# Patient Record
Sex: Male | Born: 1940 | Race: White | Hispanic: No | Smoking: Current every day smoker
Health system: Southern US, Community
[De-identification: ages and names within clinical notes are randomized; demographics above are authoritative.]

## PROBLEM LIST (undated history)

## (undated) ENCOUNTER — Emergency Department (HOSPITAL_COMMUNITY): Payer: HMO | Source: Home / Self Care

## (undated) DIAGNOSIS — I10 Essential (primary) hypertension: Secondary | ICD-10-CM

## (undated) DIAGNOSIS — I472 Ventricular tachycardia, unspecified: Secondary | ICD-10-CM

## (undated) DIAGNOSIS — I429 Cardiomyopathy, unspecified: Secondary | ICD-10-CM

## (undated) DIAGNOSIS — E785 Hyperlipidemia, unspecified: Secondary | ICD-10-CM

## (undated) DIAGNOSIS — I2510 Atherosclerotic heart disease of native coronary artery without angina pectoris: Secondary | ICD-10-CM

## (undated) DIAGNOSIS — I4891 Unspecified atrial fibrillation: Secondary | ICD-10-CM

## (undated) DIAGNOSIS — I110 Hypertensive heart disease with heart failure: Secondary | ICD-10-CM

## (undated) DIAGNOSIS — N182 Chronic kidney disease, stage 2 (mild): Secondary | ICD-10-CM

## (undated) DIAGNOSIS — F17200 Nicotine dependence, unspecified, uncomplicated: Secondary | ICD-10-CM

## (undated) DIAGNOSIS — C801 Malignant (primary) neoplasm, unspecified: Secondary | ICD-10-CM

## (undated) DIAGNOSIS — E119 Type 2 diabetes mellitus without complications: Secondary | ICD-10-CM

## (undated) DIAGNOSIS — E1122 Type 2 diabetes mellitus with diabetic chronic kidney disease: Secondary | ICD-10-CM

## (undated) HISTORY — DX: Essential (primary) hypertension: I10

## (undated) HISTORY — DX: Cardiomyopathy, unspecified: I42.9

## (undated) HISTORY — DX: Hypertensive heart disease with heart failure: I11.0

## (undated) HISTORY — PX: TONSILLECTOMY: SUR1361

## (undated) HISTORY — DX: Ventricular tachycardia, unspecified: I47.20

## (undated) HISTORY — DX: Atherosclerotic heart disease of native coronary artery without angina pectoris: I25.10

## (undated) HISTORY — DX: Nicotine dependence, unspecified, uncomplicated: F17.200

## (undated) HISTORY — DX: Ventricular tachycardia: I47.2

## (undated) HISTORY — DX: Chronic kidney disease, stage 2 (mild): N18.2

## (undated) HISTORY — DX: Hyperlipidemia, unspecified: E78.5

## (undated) HISTORY — DX: Unspecified atrial fibrillation: I48.91

## (undated) HISTORY — PX: IMPLANTABLE CARDIOVERTER DEFIBRILLATOR IMPLANT: SHX5860

## (undated) HISTORY — DX: Type 2 diabetes mellitus with diabetic chronic kidney disease: E11.22

---

## 2015-04-30 DIAGNOSIS — I5022 Chronic systolic (congestive) heart failure: Secondary | ICD-10-CM | POA: Insufficient documentation

## 2015-04-30 DIAGNOSIS — I472 Ventricular tachycardia, unspecified: Secondary | ICD-10-CM | POA: Insufficient documentation

## 2015-04-30 DIAGNOSIS — I213 ST elevation (STEMI) myocardial infarction of unspecified site: Secondary | ICD-10-CM | POA: Insufficient documentation

## 2015-04-30 DIAGNOSIS — E785 Hyperlipidemia, unspecified: Secondary | ICD-10-CM | POA: Insufficient documentation

## 2015-04-30 DIAGNOSIS — I2510 Atherosclerotic heart disease of native coronary artery without angina pectoris: Secondary | ICD-10-CM | POA: Insufficient documentation

## 2015-04-30 DIAGNOSIS — I429 Cardiomyopathy, unspecified: Secondary | ICD-10-CM | POA: Insufficient documentation

## 2015-04-30 DIAGNOSIS — I4891 Unspecified atrial fibrillation: Secondary | ICD-10-CM | POA: Insufficient documentation

## 2015-11-05 DIAGNOSIS — F17200 Nicotine dependence, unspecified, uncomplicated: Secondary | ICD-10-CM | POA: Diagnosis not present

## 2015-11-05 DIAGNOSIS — Z2821 Immunization not carried out because of patient refusal: Secondary | ICD-10-CM | POA: Diagnosis not present

## 2015-11-05 DIAGNOSIS — Z1211 Encounter for screening for malignant neoplasm of colon: Secondary | ICD-10-CM | POA: Diagnosis not present

## 2015-11-05 DIAGNOSIS — Z0000 Encounter for general adult medical examination without abnormal findings: Secondary | ICD-10-CM | POA: Diagnosis not present

## 2015-11-05 DIAGNOSIS — Z1212 Encounter for screening for malignant neoplasm of rectum: Secondary | ICD-10-CM | POA: Diagnosis not present

## 2015-11-05 DIAGNOSIS — Z125 Encounter for screening for malignant neoplasm of prostate: Secondary | ICD-10-CM | POA: Diagnosis not present

## 2015-11-05 DIAGNOSIS — E1169 Type 2 diabetes mellitus with other specified complication: Secondary | ICD-10-CM | POA: Diagnosis not present

## 2015-11-24 DIAGNOSIS — I4891 Unspecified atrial fibrillation: Secondary | ICD-10-CM | POA: Diagnosis not present

## 2015-11-24 DIAGNOSIS — E785 Hyperlipidemia, unspecified: Secondary | ICD-10-CM | POA: Diagnosis not present

## 2015-11-24 DIAGNOSIS — I110 Hypertensive heart disease with heart failure: Secondary | ICD-10-CM | POA: Diagnosis not present

## 2015-11-24 DIAGNOSIS — I472 Ventricular tachycardia: Secondary | ICD-10-CM | POA: Diagnosis not present

## 2015-11-24 DIAGNOSIS — E1169 Type 2 diabetes mellitus with other specified complication: Secondary | ICD-10-CM | POA: Diagnosis not present

## 2015-11-24 DIAGNOSIS — F17200 Nicotine dependence, unspecified, uncomplicated: Secondary | ICD-10-CM | POA: Diagnosis not present

## 2015-11-27 DIAGNOSIS — E1169 Type 2 diabetes mellitus with other specified complication: Secondary | ICD-10-CM | POA: Diagnosis not present

## 2016-03-04 DIAGNOSIS — Z9181 History of falling: Secondary | ICD-10-CM | POA: Diagnosis not present

## 2016-03-04 DIAGNOSIS — I110 Hypertensive heart disease with heart failure: Secondary | ICD-10-CM | POA: Diagnosis not present

## 2016-03-04 DIAGNOSIS — E785 Hyperlipidemia, unspecified: Secondary | ICD-10-CM | POA: Diagnosis not present

## 2016-03-04 DIAGNOSIS — E1169 Type 2 diabetes mellitus with other specified complication: Secondary | ICD-10-CM | POA: Diagnosis not present

## 2016-03-04 DIAGNOSIS — Z136 Encounter for screening for cardiovascular disorders: Secondary | ICD-10-CM | POA: Diagnosis not present

## 2016-03-04 DIAGNOSIS — Z0000 Encounter for general adult medical examination without abnormal findings: Secondary | ICD-10-CM | POA: Diagnosis not present

## 2016-03-17 DIAGNOSIS — E1169 Type 2 diabetes mellitus with other specified complication: Secondary | ICD-10-CM | POA: Diagnosis not present

## 2016-03-17 DIAGNOSIS — I429 Cardiomyopathy, unspecified: Secondary | ICD-10-CM | POA: Diagnosis not present

## 2016-03-17 DIAGNOSIS — I110 Hypertensive heart disease with heart failure: Secondary | ICD-10-CM | POA: Diagnosis not present

## 2016-03-17 DIAGNOSIS — Z720 Tobacco use: Secondary | ICD-10-CM | POA: Diagnosis not present

## 2016-03-17 DIAGNOSIS — E785 Hyperlipidemia, unspecified: Secondary | ICD-10-CM | POA: Diagnosis not present

## 2016-03-17 DIAGNOSIS — I4891 Unspecified atrial fibrillation: Secondary | ICD-10-CM | POA: Diagnosis not present

## 2016-03-17 DIAGNOSIS — H6092 Unspecified otitis externa, left ear: Secondary | ICD-10-CM | POA: Diagnosis not present

## 2016-03-30 DIAGNOSIS — R5383 Other fatigue: Secondary | ICD-10-CM | POA: Diagnosis not present

## 2016-03-30 DIAGNOSIS — H6122 Impacted cerumen, left ear: Secondary | ICD-10-CM | POA: Diagnosis not present

## 2016-03-30 DIAGNOSIS — H9190 Unspecified hearing loss, unspecified ear: Secondary | ICD-10-CM | POA: Diagnosis not present

## 2016-04-25 DIAGNOSIS — E1122 Type 2 diabetes mellitus with diabetic chronic kidney disease: Secondary | ICD-10-CM | POA: Diagnosis not present

## 2016-06-21 DIAGNOSIS — H6242 Otitis externa in other diseases classified elsewhere, left ear: Secondary | ICD-10-CM | POA: Diagnosis not present

## 2016-06-21 DIAGNOSIS — I472 Ventricular tachycardia: Secondary | ICD-10-CM | POA: Diagnosis not present

## 2016-06-21 DIAGNOSIS — F17200 Nicotine dependence, unspecified, uncomplicated: Secondary | ICD-10-CM | POA: Diagnosis not present

## 2016-06-21 DIAGNOSIS — I4891 Unspecified atrial fibrillation: Secondary | ICD-10-CM | POA: Diagnosis not present

## 2016-06-21 DIAGNOSIS — B369 Superficial mycosis, unspecified: Secondary | ICD-10-CM | POA: Diagnosis not present

## 2016-06-21 DIAGNOSIS — N182 Chronic kidney disease, stage 2 (mild): Secondary | ICD-10-CM | POA: Diagnosis not present

## 2016-06-21 DIAGNOSIS — I110 Hypertensive heart disease with heart failure: Secondary | ICD-10-CM | POA: Diagnosis not present

## 2016-06-21 DIAGNOSIS — E785 Hyperlipidemia, unspecified: Secondary | ICD-10-CM | POA: Diagnosis not present

## 2016-06-21 DIAGNOSIS — E1122 Type 2 diabetes mellitus with diabetic chronic kidney disease: Secondary | ICD-10-CM | POA: Diagnosis not present

## 2016-06-21 DIAGNOSIS — I429 Cardiomyopathy, unspecified: Secondary | ICD-10-CM | POA: Diagnosis not present

## 2016-06-28 DIAGNOSIS — H9212 Otorrhea, left ear: Secondary | ICD-10-CM | POA: Diagnosis not present

## 2016-06-28 DIAGNOSIS — H6121 Impacted cerumen, right ear: Secondary | ICD-10-CM | POA: Diagnosis not present

## 2016-06-28 DIAGNOSIS — N182 Chronic kidney disease, stage 2 (mild): Secondary | ICD-10-CM | POA: Diagnosis not present

## 2016-06-28 DIAGNOSIS — E1122 Type 2 diabetes mellitus with diabetic chronic kidney disease: Secondary | ICD-10-CM | POA: Diagnosis not present

## 2016-07-05 DIAGNOSIS — E1122 Type 2 diabetes mellitus with diabetic chronic kidney disease: Secondary | ICD-10-CM | POA: Diagnosis not present

## 2016-07-25 DIAGNOSIS — J342 Deviated nasal septum: Secondary | ICD-10-CM | POA: Diagnosis not present

## 2016-07-25 DIAGNOSIS — H60502 Unspecified acute noninfective otitis externa, left ear: Secondary | ICD-10-CM | POA: Diagnosis not present

## 2016-07-25 DIAGNOSIS — F17200 Nicotine dependence, unspecified, uncomplicated: Secondary | ICD-10-CM | POA: Diagnosis not present

## 2016-07-25 DIAGNOSIS — K143 Hypertrophy of tongue papillae: Secondary | ICD-10-CM | POA: Diagnosis not present

## 2016-08-12 DIAGNOSIS — I429 Cardiomyopathy, unspecified: Secondary | ICD-10-CM | POA: Diagnosis not present

## 2016-08-12 DIAGNOSIS — I252 Old myocardial infarction: Secondary | ICD-10-CM | POA: Diagnosis not present

## 2016-08-12 DIAGNOSIS — I472 Ventricular tachycardia: Secondary | ICD-10-CM | POA: Diagnosis not present

## 2016-08-12 DIAGNOSIS — Z95810 Presence of automatic (implantable) cardiac defibrillator: Secondary | ICD-10-CM | POA: Diagnosis not present

## 2016-10-07 DIAGNOSIS — Z95810 Presence of automatic (implantable) cardiac defibrillator: Secondary | ICD-10-CM | POA: Diagnosis not present

## 2016-10-28 DIAGNOSIS — E1122 Type 2 diabetes mellitus with diabetic chronic kidney disease: Secondary | ICD-10-CM | POA: Diagnosis not present

## 2016-10-28 DIAGNOSIS — E785 Hyperlipidemia, unspecified: Secondary | ICD-10-CM | POA: Diagnosis not present

## 2016-10-28 DIAGNOSIS — I110 Hypertensive heart disease with heart failure: Secondary | ICD-10-CM | POA: Diagnosis not present

## 2016-10-28 DIAGNOSIS — N182 Chronic kidney disease, stage 2 (mild): Secondary | ICD-10-CM | POA: Diagnosis not present

## 2016-11-11 DIAGNOSIS — F17200 Nicotine dependence, unspecified, uncomplicated: Secondary | ICD-10-CM | POA: Diagnosis not present

## 2016-11-11 DIAGNOSIS — R05 Cough: Secondary | ICD-10-CM | POA: Diagnosis not present

## 2017-01-06 DIAGNOSIS — Z95810 Presence of automatic (implantable) cardiac defibrillator: Secondary | ICD-10-CM | POA: Diagnosis not present

## 2017-01-31 DIAGNOSIS — E785 Hyperlipidemia, unspecified: Secondary | ICD-10-CM | POA: Diagnosis not present

## 2017-01-31 DIAGNOSIS — N182 Chronic kidney disease, stage 2 (mild): Secondary | ICD-10-CM | POA: Diagnosis not present

## 2017-01-31 DIAGNOSIS — I110 Hypertensive heart disease with heart failure: Secondary | ICD-10-CM | POA: Diagnosis not present

## 2017-01-31 DIAGNOSIS — E1122 Type 2 diabetes mellitus with diabetic chronic kidney disease: Secondary | ICD-10-CM | POA: Diagnosis not present

## 2017-02-09 DIAGNOSIS — R21 Rash and other nonspecific skin eruption: Secondary | ICD-10-CM | POA: Diagnosis not present

## 2017-03-07 DIAGNOSIS — Z125 Encounter for screening for malignant neoplasm of prostate: Secondary | ICD-10-CM | POA: Diagnosis not present

## 2017-03-07 DIAGNOSIS — Z1211 Encounter for screening for malignant neoplasm of colon: Secondary | ICD-10-CM | POA: Diagnosis not present

## 2017-03-07 DIAGNOSIS — Z0000 Encounter for general adult medical examination without abnormal findings: Secondary | ICD-10-CM | POA: Diagnosis not present

## 2017-03-07 DIAGNOSIS — I110 Hypertensive heart disease with heart failure: Secondary | ICD-10-CM | POA: Diagnosis not present

## 2017-03-30 DIAGNOSIS — H25813 Combined forms of age-related cataract, bilateral: Secondary | ICD-10-CM | POA: Diagnosis not present

## 2017-03-30 DIAGNOSIS — E119 Type 2 diabetes mellitus without complications: Secondary | ICD-10-CM | POA: Diagnosis not present

## 2017-05-11 ENCOUNTER — Other Ambulatory Visit: Payer: Self-pay

## 2017-05-11 DIAGNOSIS — R634 Abnormal weight loss: Secondary | ICD-10-CM | POA: Insufficient documentation

## 2017-05-11 DIAGNOSIS — E1122 Type 2 diabetes mellitus with diabetic chronic kidney disease: Secondary | ICD-10-CM | POA: Diagnosis not present

## 2017-05-11 DIAGNOSIS — J342 Deviated nasal septum: Secondary | ICD-10-CM | POA: Insufficient documentation

## 2017-05-11 DIAGNOSIS — R059 Cough, unspecified: Secondary | ICD-10-CM | POA: Insufficient documentation

## 2017-05-11 DIAGNOSIS — R079 Chest pain, unspecified: Secondary | ICD-10-CM | POA: Insufficient documentation

## 2017-05-11 DIAGNOSIS — F17200 Nicotine dependence, unspecified, uncomplicated: Secondary | ICD-10-CM | POA: Insufficient documentation

## 2017-05-11 DIAGNOSIS — Z2821 Immunization not carried out because of patient refusal: Secondary | ICD-10-CM | POA: Insufficient documentation

## 2017-05-11 DIAGNOSIS — I5020 Unspecified systolic (congestive) heart failure: Secondary | ICD-10-CM | POA: Insufficient documentation

## 2017-05-11 DIAGNOSIS — N182 Chronic kidney disease, stage 2 (mild): Secondary | ICD-10-CM | POA: Diagnosis not present

## 2017-05-11 DIAGNOSIS — H9212 Otorrhea, left ear: Secondary | ICD-10-CM | POA: Insufficient documentation

## 2017-05-11 DIAGNOSIS — H6092 Unspecified otitis externa, left ear: Secondary | ICD-10-CM | POA: Insufficient documentation

## 2017-05-11 DIAGNOSIS — E119 Type 2 diabetes mellitus without complications: Secondary | ICD-10-CM | POA: Insufficient documentation

## 2017-05-11 DIAGNOSIS — I110 Hypertensive heart disease with heart failure: Secondary | ICD-10-CM | POA: Diagnosis not present

## 2017-05-11 DIAGNOSIS — R9389 Abnormal findings on diagnostic imaging of other specified body structures: Secondary | ICD-10-CM | POA: Insufficient documentation

## 2017-05-11 DIAGNOSIS — I252 Old myocardial infarction: Secondary | ICD-10-CM | POA: Insufficient documentation

## 2017-05-11 DIAGNOSIS — Z95810 Presence of automatic (implantable) cardiac defibrillator: Secondary | ICD-10-CM | POA: Insufficient documentation

## 2017-05-11 DIAGNOSIS — K143 Hypertrophy of tongue papillae: Secondary | ICD-10-CM | POA: Insufficient documentation

## 2017-05-11 DIAGNOSIS — R05 Cough: Secondary | ICD-10-CM | POA: Insufficient documentation

## 2017-05-11 DIAGNOSIS — E785 Hyperlipidemia, unspecified: Secondary | ICD-10-CM | POA: Diagnosis not present

## 2017-05-11 DIAGNOSIS — B351 Tinea unguium: Secondary | ICD-10-CM | POA: Insufficient documentation

## 2017-05-25 DIAGNOSIS — N182 Chronic kidney disease, stage 2 (mild): Secondary | ICD-10-CM | POA: Diagnosis not present

## 2017-05-25 DIAGNOSIS — E1122 Type 2 diabetes mellitus with diabetic chronic kidney disease: Secondary | ICD-10-CM | POA: Diagnosis not present

## 2017-05-31 ENCOUNTER — Ambulatory Visit: Payer: Self-pay | Admitting: Cardiology

## 2017-06-02 DIAGNOSIS — N182 Chronic kidney disease, stage 2 (mild): Secondary | ICD-10-CM | POA: Diagnosis not present

## 2017-06-02 DIAGNOSIS — E1122 Type 2 diabetes mellitus with diabetic chronic kidney disease: Secondary | ICD-10-CM | POA: Diagnosis not present

## 2017-06-30 DIAGNOSIS — E1122 Type 2 diabetes mellitus with diabetic chronic kidney disease: Secondary | ICD-10-CM | POA: Diagnosis not present

## 2017-06-30 DIAGNOSIS — N182 Chronic kidney disease, stage 2 (mild): Secondary | ICD-10-CM | POA: Diagnosis not present

## 2017-08-11 DIAGNOSIS — Z4502 Encounter for adjustment and management of automatic implantable cardiac defibrillator: Secondary | ICD-10-CM | POA: Diagnosis not present

## 2017-10-13 DIAGNOSIS — E7800 Pure hypercholesterolemia, unspecified: Secondary | ICD-10-CM | POA: Diagnosis not present

## 2017-10-13 DIAGNOSIS — Z95810 Presence of automatic (implantable) cardiac defibrillator: Secondary | ICD-10-CM | POA: Diagnosis not present

## 2017-10-13 DIAGNOSIS — I501 Left ventricular failure: Secondary | ICD-10-CM | POA: Diagnosis not present

## 2017-10-13 DIAGNOSIS — I255 Ischemic cardiomyopathy: Secondary | ICD-10-CM | POA: Diagnosis not present

## 2017-11-13 DIAGNOSIS — Z95810 Presence of automatic (implantable) cardiac defibrillator: Secondary | ICD-10-CM | POA: Diagnosis not present

## 2018-02-12 DIAGNOSIS — Z95810 Presence of automatic (implantable) cardiac defibrillator: Secondary | ICD-10-CM | POA: Diagnosis not present

## 2018-05-14 DIAGNOSIS — Z95810 Presence of automatic (implantable) cardiac defibrillator: Secondary | ICD-10-CM | POA: Diagnosis not present

## 2018-06-15 DIAGNOSIS — I255 Ischemic cardiomyopathy: Secondary | ICD-10-CM | POA: Diagnosis not present

## 2018-06-15 DIAGNOSIS — E7800 Pure hypercholesterolemia, unspecified: Secondary | ICD-10-CM | POA: Diagnosis not present

## 2018-06-15 DIAGNOSIS — I472 Ventricular tachycardia: Secondary | ICD-10-CM | POA: Diagnosis not present

## 2018-06-15 DIAGNOSIS — I5022 Chronic systolic (congestive) heart failure: Secondary | ICD-10-CM | POA: Diagnosis not present

## 2018-07-06 DIAGNOSIS — E119 Type 2 diabetes mellitus without complications: Secondary | ICD-10-CM | POA: Diagnosis not present

## 2018-07-20 DIAGNOSIS — E1165 Type 2 diabetes mellitus with hyperglycemia: Secondary | ICD-10-CM | POA: Diagnosis not present

## 2018-08-10 DIAGNOSIS — Z79899 Other long term (current) drug therapy: Secondary | ICD-10-CM | POA: Diagnosis not present

## 2018-08-10 DIAGNOSIS — E1165 Type 2 diabetes mellitus with hyperglycemia: Secondary | ICD-10-CM | POA: Diagnosis not present

## 2018-08-13 DIAGNOSIS — Z95810 Presence of automatic (implantable) cardiac defibrillator: Secondary | ICD-10-CM | POA: Diagnosis not present

## 2019-01-01 DIAGNOSIS — Z95810 Presence of automatic (implantable) cardiac defibrillator: Secondary | ICD-10-CM | POA: Diagnosis not present

## 2019-01-23 DIAGNOSIS — Z4502 Encounter for adjustment and management of automatic implantable cardiac defibrillator: Secondary | ICD-10-CM | POA: Diagnosis not present

## 2019-01-23 DIAGNOSIS — I5022 Chronic systolic (congestive) heart failure: Secondary | ICD-10-CM | POA: Diagnosis not present

## 2019-04-02 DIAGNOSIS — Z95810 Presence of automatic (implantable) cardiac defibrillator: Secondary | ICD-10-CM | POA: Diagnosis not present

## 2019-04-28 DIAGNOSIS — J439 Emphysema, unspecified: Secondary | ICD-10-CM | POA: Diagnosis not present

## 2019-04-28 DIAGNOSIS — M5489 Other dorsalgia: Secondary | ICD-10-CM | POA: Diagnosis not present

## 2019-04-28 DIAGNOSIS — R52 Pain, unspecified: Secondary | ICD-10-CM | POA: Diagnosis not present

## 2019-04-28 DIAGNOSIS — R5381 Other malaise: Secondary | ICD-10-CM | POA: Diagnosis not present

## 2019-04-28 DIAGNOSIS — R109 Unspecified abdominal pain: Secondary | ICD-10-CM | POA: Diagnosis not present

## 2019-04-28 DIAGNOSIS — S2232XA Fracture of one rib, left side, initial encounter for closed fracture: Secondary | ICD-10-CM | POA: Diagnosis not present

## 2019-04-28 DIAGNOSIS — W19XXXA Unspecified fall, initial encounter: Secondary | ICD-10-CM | POA: Diagnosis not present

## 2019-04-28 DIAGNOSIS — I700 Atherosclerosis of aorta: Secondary | ICD-10-CM | POA: Diagnosis not present

## 2021-04-26 ENCOUNTER — Emergency Department (HOSPITAL_COMMUNITY): Payer: HMO

## 2021-04-26 ENCOUNTER — Emergency Department (HOSPITAL_COMMUNITY)
Admission: EM | Admit: 2021-04-26 | Discharge: 2021-04-26 | Disposition: A | Discharge status: Home / Self Care | Payer: HMO | Attending: Emergency Medicine | Admitting: Emergency Medicine

## 2021-04-26 ENCOUNTER — Other Ambulatory Visit: Payer: Self-pay

## 2021-04-26 ENCOUNTER — Encounter (HOSPITAL_COMMUNITY): Payer: Self-pay | Admitting: *Deleted

## 2021-04-26 DIAGNOSIS — Z7984 Long term (current) use of oral hypoglycemic drugs: Secondary | ICD-10-CM | POA: Insufficient documentation

## 2021-04-26 DIAGNOSIS — Z85118 Personal history of other malignant neoplasm of bronchus and lung: Secondary | ICD-10-CM | POA: Diagnosis not present

## 2021-04-26 DIAGNOSIS — E119 Type 2 diabetes mellitus without complications: Secondary | ICD-10-CM | POA: Insufficient documentation

## 2021-04-26 DIAGNOSIS — I517 Cardiomegaly: Secondary | ICD-10-CM | POA: Insufficient documentation

## 2021-04-26 DIAGNOSIS — Z20822 Contact with and (suspected) exposure to covid-19: Secondary | ICD-10-CM | POA: Diagnosis not present

## 2021-04-26 DIAGNOSIS — R911 Solitary pulmonary nodule: Secondary | ICD-10-CM | POA: Insufficient documentation

## 2021-04-26 DIAGNOSIS — I7102 Dissection of abdominal aorta: Secondary | ICD-10-CM | POA: Insufficient documentation

## 2021-04-26 DIAGNOSIS — I10 Essential (primary) hypertension: Secondary | ICD-10-CM | POA: Insufficient documentation

## 2021-04-26 DIAGNOSIS — Z79899 Other long term (current) drug therapy: Secondary | ICD-10-CM | POA: Diagnosis not present

## 2021-04-26 DIAGNOSIS — S299XXA Unspecified injury of thorax, initial encounter: Secondary | ICD-10-CM | POA: Diagnosis present

## 2021-04-26 DIAGNOSIS — X58XXXA Exposure to other specified factors, initial encounter: Secondary | ICD-10-CM | POA: Insufficient documentation

## 2021-04-26 DIAGNOSIS — F17210 Nicotine dependence, cigarettes, uncomplicated: Secondary | ICD-10-CM | POA: Insufficient documentation

## 2021-04-26 DIAGNOSIS — T1490XA Injury, unspecified, initial encounter: Secondary | ICD-10-CM

## 2021-04-26 HISTORY — DX: Essential (primary) hypertension: I10

## 2021-04-26 HISTORY — DX: Type 2 diabetes mellitus without complications: E11.9

## 2021-04-26 HISTORY — DX: Malignant (primary) neoplasm, unspecified: C80.1

## 2021-04-26 LAB — I-STAT CHEM 8, ED
BUN: 12 mg/dL (ref 8–23)
Calcium, Ion: 1.06 mmol/L — ABNORMAL LOW (ref 1.15–1.40)
Chloride: 104 mmol/L (ref 98–111)
Creatinine, Ser: 0.8 mg/dL (ref 0.61–1.24)
Glucose, Bld: 141 mg/dL — ABNORMAL HIGH (ref 70–99)
HCT: 35 % — ABNORMAL LOW (ref 39.0–52.0)
Hemoglobin: 11.9 g/dL — ABNORMAL LOW (ref 13.0–17.0)
Potassium: 3.7 mmol/L (ref 3.5–5.1)
Sodium: 138 mmol/L (ref 135–145)
TCO2: 23 mmol/L (ref 22–32)

## 2021-04-26 LAB — RESP PANEL BY RT-PCR (FLU A&B, COVID) ARPGX2
Influenza A by PCR: NEGATIVE
Influenza B by PCR: NEGATIVE
SARS Coronavirus 2 by RT PCR: NEGATIVE

## 2021-04-26 LAB — CBC
HCT: 38.2 % — ABNORMAL LOW (ref 39.0–52.0)
Hemoglobin: 12.8 g/dL — ABNORMAL LOW (ref 13.0–17.0)
MCH: 32.6 pg (ref 26.0–34.0)
MCHC: 33.5 g/dL (ref 30.0–36.0)
MCV: 97.2 fL (ref 80.0–100.0)
Platelets: 222 10*3/uL (ref 150–400)
RBC: 3.93 MIL/uL — ABNORMAL LOW (ref 4.22–5.81)
RDW: 14.6 % (ref 11.5–15.5)
WBC: 5.9 10*3/uL (ref 4.0–10.5)
nRBC: 0 % (ref 0.0–0.2)

## 2021-04-26 LAB — COMPREHENSIVE METABOLIC PANEL
ALT: 12 U/L (ref 0–44)
AST: 13 U/L — ABNORMAL LOW (ref 15–41)
Albumin: 3.5 g/dL (ref 3.5–5.0)
Alkaline Phosphatase: 69 U/L (ref 38–126)
Anion gap: 9 (ref 5–15)
BUN: 12 mg/dL (ref 8–23)
CO2: 23 mmol/L (ref 22–32)
Calcium: 8.7 mg/dL — ABNORMAL LOW (ref 8.9–10.3)
Chloride: 103 mmol/L (ref 98–111)
Creatinine, Ser: 1.01 mg/dL (ref 0.61–1.24)
GFR, Estimated: >60 mL/min (ref >60)
Glucose, Bld: 155 mg/dL — ABNORMAL HIGH (ref 70–99)
Potassium: 3.9 mmol/L (ref 3.5–5.1)
Sodium: 135 mmol/L (ref 135–145)
Total Bilirubin: 1.8 mg/dL — ABNORMAL HIGH (ref 0.3–1.2)
Total Protein: 6.6 g/dL (ref 6.5–8.1)

## 2021-04-26 LAB — URINALYSIS, ROUTINE W REFLEX MICROSCOPIC
Bilirubin Urine: NEGATIVE
Glucose, UA: NEGATIVE mg/dL
Hgb urine dipstick: NEGATIVE
Ketones, ur: NEGATIVE mg/dL
Leukocytes,Ua: NEGATIVE
Nitrite: NEGATIVE
Protein, ur: NEGATIVE mg/dL
Specific Gravity, Urine: 1.032 — ABNORMAL HIGH (ref 1.005–1.030)
pH: 6 (ref 5.0–8.0)

## 2021-04-26 LAB — PROTIME-INR
INR: 1.1 (ref 0.8–1.2)
Prothrombin Time: 13.9 seconds (ref 11.4–15.2)

## 2021-04-26 LAB — LACTIC ACID, PLASMA: Lactic Acid, Venous: 1.9 mmol/L (ref 0.5–1.9)

## 2021-04-26 LAB — SAMPLE TO BLOOD BANK

## 2021-04-26 LAB — ETHANOL: Alcohol, Ethyl (B): <10 mg/dL (ref <10)

## 2021-04-26 MED ORDER — IOHEXOL 350 MG/ML SOLN
100.0000 mL | Freq: Once | INTRAVENOUS | Status: AC | PRN
  Administered 2021-04-26: 100 mL via INTRAVENOUS

## 2021-04-26 NOTE — ED Notes (Signed)
Patient states he spoke with his niece Janace Hoard and she is going to pick up his coat and then come get him. Patient given [place] sandwich, beverage, and placed in lobby to wait for his ride. Patient ambulated to lobby independently with steady gait.
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
## 2021-04-26 NOTE — ED Notes (Signed)
The niece Janace Hoard would like to get an update she is unable to come and sit with patient this is her update number [phone]
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
## 2021-05-09 DIAGNOSIS — I447 Left bundle-branch block, unspecified: Secondary | ICD-10-CM | POA: Diagnosis not present

## 2021-05-30 DEATH — deceased
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
